# Patient Record
Sex: Female | Born: 1945 | Race: Black or African American | Hispanic: No
Health system: Southern US, Community
[De-identification: ages and names within clinical notes are randomized; demographics above are authoritative.]

## PROBLEM LIST (undated history)

## (undated) DIAGNOSIS — M549 Dorsalgia, unspecified: Secondary | ICD-10-CM

---

## 2008-12-09 ENCOUNTER — Ambulatory Visit: Payer: Self-pay | Admitting: Diagnostic Radiology

## 2008-12-09 ENCOUNTER — Emergency Department (HOSPITAL_BASED_OUTPATIENT_CLINIC_OR_DEPARTMENT_OTHER): Admission: EM | Admit: 2008-12-09 | Discharge: 2008-12-09 | Payer: Self-pay | Admitting: Emergency Medicine

## 2010-06-04 LAB — CBC
MCHC: 33.8 g/dL (ref 30.0–36.0)
MCV: 93.1 fL (ref 78.0–100.0)
RDW: 13.7 % (ref 11.5–15.5)
WBC: 5 10*3/uL (ref 4.0–10.5)

## 2010-06-04 LAB — URINE MICROSCOPIC-ADD ON

## 2010-06-04 LAB — BASIC METABOLIC PANEL
BUN: 16 mg/dL (ref 6–23)
CO2: 29 mEq/L (ref 19–32)
Calcium: 9 mg/dL (ref 8.4–10.5)
Chloride: 106 mEq/L (ref 96–112)
GFR calc non Af Amer: >60 mL/min (ref >60)
Sodium: 144 mEq/L (ref 135–145)

## 2010-06-04 LAB — URINALYSIS, ROUTINE W REFLEX MICROSCOPIC
Hgb urine dipstick: NEGATIVE
Nitrite: NEGATIVE
Protein, ur: NEGATIVE mg/dL
Specific Gravity, Urine: 1.018 (ref 1.005–1.030)
pH: 7.5 (ref 5.0–8.0)

## 2010-06-04 LAB — URINE CULTURE

## 2010-06-04 LAB — DIFFERENTIAL
Basophils Absolute: 0.1 10*3/uL (ref 0.0–0.1)
Basophils Relative: 1 % (ref 0–1)
Eosinophils Relative: 3 % (ref 0–5)
Lymphocytes Relative: 41 % (ref 12–46)
Neutrophils Relative %: 45 % (ref 43–77)

## 2010-06-04 LAB — POCT CARDIAC MARKERS: Troponin i, poc: <0.05 ng/mL (ref 0.00–0.09)

## 2011-12-06 ENCOUNTER — Emergency Department (HOSPITAL_BASED_OUTPATIENT_CLINIC_OR_DEPARTMENT_OTHER)
Admission: EM | Admit: 2011-12-06 | Discharge: 2011-12-06 | Disposition: A | Discharge status: Home / Self Care | Payer: Self-pay | Attending: Emergency Medicine | Admitting: Emergency Medicine

## 2011-12-06 ENCOUNTER — Emergency Department (HOSPITAL_BASED_OUTPATIENT_CLINIC_OR_DEPARTMENT_OTHER): Payer: Self-pay

## 2011-12-06 ENCOUNTER — Encounter (HOSPITAL_BASED_OUTPATIENT_CLINIC_OR_DEPARTMENT_OTHER): Payer: Self-pay | Admitting: Family Medicine

## 2011-12-06 DIAGNOSIS — R10819 Abdominal tenderness, unspecified site: Secondary | ICD-10-CM | POA: Insufficient documentation

## 2011-12-06 DIAGNOSIS — R109 Unspecified abdominal pain: Secondary | ICD-10-CM | POA: Insufficient documentation

## 2011-12-06 DIAGNOSIS — M5136 Other intervertebral disc degeneration, lumbar region: Secondary | ICD-10-CM

## 2011-12-06 DIAGNOSIS — M5137 Other intervertebral disc degeneration, lumbosacral region: Secondary | ICD-10-CM | POA: Insufficient documentation

## 2011-12-06 DIAGNOSIS — N3090 Cystitis, unspecified without hematuria: Secondary | ICD-10-CM | POA: Insufficient documentation

## 2011-12-06 DIAGNOSIS — M51379 Other intervertebral disc degeneration, lumbosacral region without mention of lumbar back pain or lower extremity pain: Secondary | ICD-10-CM | POA: Insufficient documentation

## 2011-12-06 HISTORY — DX: Dorsalgia, unspecified: M54.9

## 2011-12-06 LAB — URINALYSIS, ROUTINE W REFLEX MICROSCOPIC
Bilirubin Urine: NEGATIVE
Glucose, UA: NEGATIVE mg/dL
Ketones, ur: NEGATIVE mg/dL
Leukocytes, UA: NEGATIVE
Nitrite: NEGATIVE
Protein, ur: NEGATIVE mg/dL
Specific Gravity, Urine: 1.021 (ref 1.005–1.030)
Urobilinogen, UA: 1 mg/dL (ref 0.0–1.0)
pH: 6.5 (ref 5.0–8.0)

## 2011-12-06 LAB — URINE MICROSCOPIC-ADD ON

## 2011-12-06 MED ORDER — NAPROXEN 500 MG PO TABS: 500.0000 mg | ORAL_TABLET | Freq: Two times a day (BID) | ORAL | Status: DC

## 2011-12-06 MED ORDER — SULFAMETHOXAZOLE-TRIMETHOPRIM 800-160 MG PO TABS: 1.0000 | ORAL_TABLET | Freq: Two times a day (BID) | ORAL | Status: DC

## 2011-12-06 MED ORDER — HYDROCODONE-ACETAMINOPHEN 5-500 MG PO TABS: 1.0000 | ORAL_TABLET | Freq: Four times a day (QID) | ORAL | Status: DC | PRN

## 2011-12-06 NOTE — ED Notes (Signed)
Pt moved here from [place] in June per daughter. Pt does not speak [place] and daughter translating in triage. Pt c/o back pain and frequent urination since Saturday with h/o same. Pt also c/o bilateral knee pain and swelling and right arm pain, tingling x 5 days.

## 2011-12-06 NOTE — ED Notes (Signed)
Pt reports chronic back pain, but that it got severe this past Thursday. Daughter reports urinary frequency with the back pain. Pt is also c/o right arm pain and headache. Pain is 9/10. Last took 200mg  Ibuprofen approximately 3am today. Pt does not speak english. Daughter interprets for her.

## 2011-12-06 NOTE — ED Provider Notes (Addendum)
History     CSN: [id number]  Arrival date & time 12/06/11  1030   First MD Initiated Contact with Patient 12/06/11 1056      Chief Complaint  Patient presents with  . Back Pain  . Urinary Frequency    (Consider location/radiation/quality/duration/timing/severity/associated sxs/prior treatment) Patient is a 66 y.o. female presenting with back pain and frequency. The history is provided by the patient.  Back Pain  This is a chronic problem. Episode onset: 20 years of back pain but worses in the last 4 days. The problem occurs constantly. The problem has been gradually worsening. The pain is associated with no known injury. The pain is present in the lumbar spine. The quality of the pain is described as aching. The pain radiates to the left thigh and right thigh. The pain is at a severity of 8/10. The pain is moderate. The symptoms are aggravated by bending and certain positions. The pain is the same all the time. Stiffness is present all day. Associated symptoms include abdominal pain, dysuria and leg pain. Pertinent negatives include no bowel incontinence, no bladder incontinence, no paresthesias, no paresis and no weakness. She has tried nothing for the symptoms. The treatment provided no relief. Risk factors include obesity.  Urinary Frequency This is a new problem. Episode onset: 3-4 days ago. The problem occurs constantly. The problem has not changed since onset.Associated symptoms include abdominal pain. Associated symptoms comments: No fever, diarrhea. Nothing relieves the symptoms. She has tried nothing for the symptoms. The treatment provided no relief.    Past Medical History  Diagnosis Date  . Back pain     History reviewed. No pertinent past surgical history.  No family history on file.  History  Substance Use Topics  . Smoking status: Never Smoker   . Smokeless tobacco: Not on file  . Alcohol Use: No    OB History    Grav Para Term Preterm Abortions TAB SAB Ect Mult  Living                  Review of Systems  Gastrointestinal: Positive for abdominal pain. Negative for bowel incontinence.  Genitourinary: Positive for dysuria and frequency. Negative for bladder incontinence.  Musculoskeletal: Positive for back pain.  Neurological: Negative for weakness and paresthesias.  All other systems reviewed and are negative.    Allergies  Review of patient's allergies indicates no known allergies.  Home Medications  No current outpatient prescriptions on file.  BP 132/77  Pulse 77  Temp 98.8 F (37.1 C) (Oral)  Resp 16  Ht 5\' 3"  (1.6 m)  Wt 210 lb (95.255 kg)  BMI 37.20 kg/m2  SpO2 99%  Physical Exam  Nursing note and vitals reviewed. Constitutional: She is oriented to person, place, and time. She appears well-developed and well-nourished. No distress.  HENT:  Head: Normocephalic and atraumatic.  Mouth/Throat: Oropharynx is clear and moist.  Eyes: Conjunctivae normal and EOM are normal. Pupils are equal, round, and reactive to light.  Neck: Normal range of motion. Neck supple.  Cardiovascular: Normal rate, regular rhythm and intact distal pulses.   No murmur heard. Pulmonary/Chest: Effort normal and breath sounds normal. No respiratory distress. She has no wheezes. She has no rales.  Abdominal: Soft. She exhibits no distension. There is tenderness in the suprapubic area. There is no rebound, no guarding and no CVA tenderness.  Musculoskeletal: Normal range of motion. She exhibits no edema and no tenderness.       Lumbar back: She exhibits  bony tenderness. She exhibits normal range of motion, no swelling, no pain and normal pulse.  Neurological: She is alert and oriented to person, place, and time. She has normal strength. No sensory deficit.  Reflex Scores:      Patellar reflexes are 2+ on the right side and 2+ on the left side. Skin: Skin is warm and dry. No rash noted. No erythema.  Psychiatric: She has a normal mood and affect. Her behavior  is normal.    ED Course  Procedures (including critical care time)  Labs Reviewed  URINALYSIS, ROUTINE W REFLEX MICROSCOPIC - Abnormal; Notable for the following:    Hgb urine dipstick MODERATE (*)     All other components within normal limits  URINE MICROSCOPIC-ADD ON   Ct Abdomen Pelvis Wo Contrast  12/06/2011  *RADIOLOGY REPORT*  Clinical Data: Mid back pain, hematuria, urinary frequency  CT ABDOMEN AND PELVIS WITHOUT CONTRAST  Technique:  Multidetector CT imaging of the abdomen and pelvis was performed following the standard protocol without intravenous contrast.  Comparison: None.  Findings: Motion degraded images.  Lung bases are clear.  Unenhanced liver is notable for two probable cysts or hemangiomas in the hepatic dome (series 2/image 10).  Unenhanced spleen, pancreas, and adrenal glands are within normal limits.  Gallbladder is unremarkable.  No intrahepatic or extrahepatic ductal dilatation.  Kidneys are within normal limits.  No renal calculi or hydronephrosis.  No evidence of bowel obstruction.  Normal appendix.  Atherosclerotic calcifications of the abdominal aorta and branch vessels.  No abdominopelvic ascites.  No suspicious abdominopelvic lymphadenopathy.  Uterus and bilateral ovaries are unremarkable.  No ureteral or bladder calculi.  Bladder is mildly thick-walled although underdistended.  Degenerative changes of the visualized thoracolumbar spine.  IMPRESSION: Motion degraded images.  No renal, ureteral, or bladder calculi.  No hydronephrosis.  Mildly thick-walled bladder, correlate for cystitis.   Original Report Authenticated By: Charline Bills, M.D.      1. Cystitis   2. Lumbar degenerative disc disease       MDM   Pt with chronic low back pain for >20 years.  She has had worsening pain for the last 3-4 days and urinary sx.  No sx of urinary retention or bowel incontinence.  No neurovascular compromise and no incontinence.  Pt has no infectious sx, hx of CA  or other  red flags concerning for pathologic back pain.  Pt is able to ambulate but is painful.  Normal strength and reflexes on exam.  Denies trauma.  Pt is from [place] but when moved in June was assymptomatic and sx just started with the urine the last few days.  Doubt schistosomiasis, however do to a moderate amount of hemoglobin in her urine concern for possible kidney stone or other kidney pathology. CT of the abdomen and pelvis without contrast and showed no signs of renal, ureteral or bladder calculi however she did have thickening of the bladder wall consistent with cystitis which also goes along with her symptoms. Also it did show degenerative changes in her spine which is also consistent with the back pain she's been experiencing. Patient given antibiotic for a new cystitis as well as pain control. She was recommended to followup with her regular doctor which her daughter said they would do.         Gwyneth Sprout, MD 12/06/11 1249  Gwyneth Sprout, MD 12/06/11 1252

## 2015-03-04 ENCOUNTER — Emergency Department (HOSPITAL_BASED_OUTPATIENT_CLINIC_OR_DEPARTMENT_OTHER)
Admission: EM | Admit: 2015-03-04 | Discharge: 2015-03-04 | Discharge status: Against Medical Advice | Payer: Self-pay | Attending: Emergency Medicine | Admitting: Emergency Medicine

## 2015-03-04 ENCOUNTER — Encounter (HOSPITAL_BASED_OUTPATIENT_CLINIC_OR_DEPARTMENT_OTHER): Payer: Self-pay | Admitting: *Deleted

## 2015-03-04 DIAGNOSIS — H9203 Otalgia, bilateral: Secondary | ICD-10-CM | POA: Insufficient documentation

## 2015-03-04 NOTE — ED Notes (Signed)
Pt reports bilateral ear pain since flying a year ago. Has been seen at urgent care and rx atbx in November, ears got better, but now having clear drainage with "a little bit of yellow" and "pieces of dirt" coming out also per pt.

## 2015-03-04 NOTE — ED Notes (Signed)
Pt. Up to Nurse first in no distress reports she will not wait any longer.

## 2016-06-05 ENCOUNTER — Emergency Department (HOSPITAL_BASED_OUTPATIENT_CLINIC_OR_DEPARTMENT_OTHER)
Admission: EM | Admit: 2016-06-05 | Discharge: 2016-06-05 | Disposition: A | Discharge status: Home / Self Care | Payer: Medicaid Other | Attending: Emergency Medicine | Admitting: Emergency Medicine

## 2016-06-05 ENCOUNTER — Encounter (HOSPITAL_BASED_OUTPATIENT_CLINIC_OR_DEPARTMENT_OTHER): Payer: Self-pay | Admitting: *Deleted

## 2016-06-05 ENCOUNTER — Emergency Department (HOSPITAL_BASED_OUTPATIENT_CLINIC_OR_DEPARTMENT_OTHER): Payer: Medicaid Other

## 2016-06-05 DIAGNOSIS — Y929 Unspecified place or not applicable: Secondary | ICD-10-CM | POA: Insufficient documentation

## 2016-06-05 DIAGNOSIS — S8001XA Contusion of right knee, initial encounter: Secondary | ICD-10-CM | POA: Diagnosis not present

## 2016-06-05 DIAGNOSIS — Y999 Unspecified external cause status: Secondary | ICD-10-CM | POA: Insufficient documentation

## 2016-06-05 DIAGNOSIS — S32040A Wedge compression fracture of fourth lumbar vertebra, initial encounter for closed fracture: Secondary | ICD-10-CM | POA: Insufficient documentation

## 2016-06-05 DIAGNOSIS — S5001XA Contusion of right elbow, initial encounter: Secondary | ICD-10-CM | POA: Diagnosis not present

## 2016-06-05 DIAGNOSIS — S3992XA Unspecified injury of lower back, initial encounter: Secondary | ICD-10-CM | POA: Diagnosis present

## 2016-06-05 DIAGNOSIS — Y9301 Activity, walking, marching and hiking: Secondary | ICD-10-CM | POA: Insufficient documentation

## 2016-06-05 DIAGNOSIS — S60221A Contusion of right hand, initial encounter: Secondary | ICD-10-CM | POA: Diagnosis not present

## 2016-06-05 DIAGNOSIS — S8002XA Contusion of left knee, initial encounter: Secondary | ICD-10-CM | POA: Diagnosis not present

## 2016-06-05 DIAGNOSIS — W19XXXA Unspecified fall, initial encounter: Secondary | ICD-10-CM

## 2016-06-05 DIAGNOSIS — S32000A Wedge compression fracture of unspecified lumbar vertebra, initial encounter for closed fracture: Secondary | ICD-10-CM

## 2016-06-05 DIAGNOSIS — S39012A Strain of muscle, fascia and tendon of lower back, initial encounter: Secondary | ICD-10-CM

## 2016-06-05 DIAGNOSIS — W109XXA Fall (on) (from) unspecified stairs and steps, initial encounter: Secondary | ICD-10-CM | POA: Diagnosis not present

## 2016-06-05 NOTE — ED Notes (Signed)
Pt in radiology 

## 2016-06-05 NOTE — ED Triage Notes (Signed)
Pt's daughter, who lives with pt, is at bedside interpreting for triage purposes. Reports pt stepped onto her dress while walking up a step and fell onto both knees. Denies hitting head, LOC. Presents today with R hand and bil knee pain. Pt able to ambulate. Also reports shortness of breath with exertion x2wks.

## 2016-06-05 NOTE — ED Provider Notes (Signed)
[facility] Provider Note   CSN: [id number] Arrival date & time: 06/05/16  1849  By signing my name below, I, Emily Sutton, attest that this documentation has been prepared under the direction and in the presence of Emily Bucco, MD . Electronically Signed: Nelwyn Sutton, Scribe. 06/05/2016. 8:47 PM.  History   Chief Complaint Chief Complaint  Patient presents with  . Fall   The history is provided by the patient. The history is limited by a language barrier. No language interpreter was used.    HPI Comments:  Emily Sutton is a 71 y.o. female who presents to the Emergency Department complaining of bilateral knee pain, right worse than left, s/p fall three weeks ago. Pt's daughter states that her mother was on the way into the house when she tripped over her clothes and fell forward onto her hands and knees. Pt is experiencing associated back, elbow and arm pain. Her pain is exacerbated with use and movement. No medications given PTA. Denies any numbness, weakness or any other symptoms.   Past Medical History:  Diagnosis Date  . Back pain     There are no active problems to display for this patient.   History reviewed. No pertinent surgical history.  OB History    No data available       Home Medications    Prior to Admission medications   Not on File    Family History No family history on file.  Social History Social History  Substance Use Topics  . Smoking status: Never Smoker  . Smokeless tobacco: Never Used  . Alcohol use No     Allergies   Patient has no known allergies.   Review of Systems Review of Systems  Constitutional: Negative for chills, diaphoresis, fatigue and fever.  HENT: Negative for congestion, rhinorrhea and sneezing.   Eyes: Negative.   Respiratory: Negative for cough, chest tightness and shortness of breath.   Cardiovascular: Negative for chest pain and leg swelling.  Gastrointestinal: Negative for abdominal  pain, blood in stool, diarrhea, nausea and vomiting.  Genitourinary: Negative for difficulty urinating, flank pain, frequency and hematuria.  Musculoskeletal: Positive for arthralgias, back pain and myalgias.  Skin: Negative for rash.  Neurological: Negative for dizziness, speech difficulty, weakness, numbness and headaches.     Physical Exam Updated Vital Signs BP (!) 154/76 (BP Location: Right Arm)   Pulse 76   Temp 97.9 F (36.6 C) (Oral)   Resp 19   Ht  (1.651 m)   Wt 236 lb 6.4 oz (107.2 kg)   SpO2 100%   BMI 39.34 kg/m   Physical Exam  Constitutional: She is oriented to person, place, and time. She appears well-developed and well-nourished.  HENT:  Head: Normocephalic and atraumatic.  Eyes: Pupils are equal, round, and reactive to light.  Neck: Normal range of motion. Neck supple.  Cardiovascular: Normal rate, regular rhythm and normal heart sounds.   Pulmonary/Chest: Effort normal and breath sounds normal. No respiratory distress. She has no wheezes. She has no rales. She exhibits no tenderness.  Abdominal: Soft. Bowel sounds are normal. There is no tenderness. There is no rebound and no guarding.  Musculoskeletal: Normal range of motion. She exhibits no edema.  Tenderness over right 4th and 5th MCP joints. Tender over lower lumbar region. No tenderness over thoracic or cervical spine. Bruising to the anterior parts of bilateral knees. Pain on palpation on ROM of both knees and both hips. Neurovascularly intact distally. No pain in right  shoulder or wrist.   Lymphadenopathy:    She has no cervical adenopathy.  Neurological: She is alert and oriented to person, place, and time.  Skin: Skin is warm and dry. No rash noted.  Psychiatric: She has a normal mood and affect.    ED Treatments / Results  DIAGNOSTIC STUDIES:  Oxygen Saturation is 100% on RA, normal by my interpretation.    COORDINATION OF CARE:  9:00 PM Discussed treatment plan with pt at bedside which  includes X-ray imaging of hand, elbow, back, knees and hips and pt agreed to plan.  Labs (all labs ordered are listed, but only abnormal results are displayed) Labs Reviewed - No data to display  EKG  EKG Interpretation None       Radiology Dg Lumbar Spine Complete  Result Date: 06/05/2016 CLINICAL DATA:  Larey Seat 3 weeks ago with low back pain EXAM: LUMBAR SPINE - COMPLETE 4+ VIEW COMPARISON:  12/06/2011 CT abdomen and pelvis FINDINGS: Transitional lumbosacral anatomy with the lowest square vertebral body on the lateral view labeled S1. Anterior compression fracture of L4 which is new since the 2013 CT is identified with approximately 25% anterior height loss and slight retropulsion suggested. Minimal grade 1 anterolisthesis of L5 on S1. Facet joint space narrowing and sclerosis consistent with degenerative facet arthropathy is identified from L3 caudad. Moderate colonic stool burden is noted. Small triangular density projecting over the L3 vertebral body on the lateral view may represent an ingested food particle. No definite nephrolithiasis based on prior CT. Injection granulomata project over the buttocks. IMPRESSION: Lumbosacral transitional vertebral anatomy with the lowest square vertebral body labeled S1 on the lateral view. New since 2013 is a 25% anterior compression fracture of L4 with possible minimal retropulsion. MRI would be the study of choice to assess for acuity/bone marrow edema. Electronically Signed   By: Tollie Eth M.D.   On: 06/05/2016 22:19   Dg Elbow Complete Right  Result Date: 06/05/2016 CLINICAL DATA:  Pain after fall 3 weeks ago EXAM: RIGHT ELBOW - COMPLETE 3+ VIEW COMPARISON:  None. FINDINGS: Lateral humeral epicondylar spurring consistent with changes of chronic lateral epicondylitis. No acute fracture or joint effusion. Minimal degenerative spurring off the radial head. Tiny subchondral cysts of the capitellum. IMPRESSION: Minimal osteoarthritic change of the right  elbow. No acute fracture or malalignment. Spurring off the lateral epicondyle of the humerus consistent with chronic changes of lateral epicondylitis. Electronically Signed   By: Tollie Eth M.D.   On: 06/05/2016 22:25   Dg Knee Complete 4 Views Left  Result Date: 06/05/2016 CLINICAL DATA:  Pain after fall 3 weeks ago. EXAM: LEFT KNEE - COMPLETE 4+ VIEW COMPARISON:  None. FINDINGS: Osteoarthritic joint space narrowing and spurring of the femorotibial and patellofemoral compartments are identified more severely affecting the medial femorotibial compartment. No joint effusion, acute fracture nor bone destruction is seen. Tibial spine spurring consistent with osteoarthritis is also noted. No intra-articular loose bodies. Soft tissues are unremarkable. IMPRESSION: No acute osseous abnormality of the left knee. Tricompartmental osteoarthritis. Electronically Signed   By: Tollie Eth M.D.   On: 06/05/2016 23:14   Dg Knee Complete 4 Views Right  Result Date: 06/05/2016 CLINICAL DATA:  Pain after fall 3 weeks ago EXAM: RIGHT KNEE - COMPLETE 4+ VIEW COMPARISON:  None. FINDINGS: Osteoarthritic joint space narrowing and spurring of the femorotibial and patellofemoral compartments are identified more severely affecting the medial femorotibial compartment. No joint effusion, acute fracture nor bone destruction is seen. Tibial spine spurring  consistent with osteoarthritis is also noted. No intra-articular loose bodies. Soft tissues are unremarkable. IMPRESSION: Tricompartmental osteoarthritis without acute osseous appearing abnormality. Electronically Signed   By: Tollie Eth M.D.   On: 06/05/2016 23:13   Dg Hand Complete Right  Result Date: 06/05/2016 CLINICAL DATA:  Pain after fall 3 weeks ago EXAM: RIGHT HAND - COMPLETE 3+ VIEW COMPARISON:  None. FINDINGS: Mild joint space narrowing of the digits at the interphalangeal, DIP and PIP joints. Carpal rows are maintained. No acute displaced appearing fracture. Soft tissues  are unremarkable. Carpal bossing at the base of the second metacarpal may cause mass effect on the overlying extensor tendons and can be a source of mechanical irritation or pain. IMPRESSION: No acute osseous abnormality of the right hand. Carpal bossing at the base of the second metacarpal dorsally. Osteoarthritis of the DIP, PIP and interphalangeal joints of the fingers. Electronically Signed   By: Tollie Eth M.D.   On: 06/05/2016 22:27   Dg Hips Bilat With Pelvis Min 5 Views  Result Date: 06/05/2016 CLINICAL DATA:  Bilateral hip pain after fall 3 weeks ago. EXAM: DG HIP (WITH OR WITHOUT PELVIS) 5+V BILAT COMPARISON:  None. FINDINGS: Lower lumbar degenerative facet arthropathy with disc space narrowing. Bilateral injection granulomata are seen overlying the soft tissues of the flanks. The bony pelvis appears intact. Joint spaces are maintained about both hips. The pubic rami appear intact without fracture. The sacroiliac joints, pubic symphysis and pubic rami appear intact. No proximal femoral fracture is noted. IMPRESSION: No acute osseous abnormality of the pelvis and both hips. Lower lumbar degenerative disc and facet arthropathy. Electronically Signed   By: Tollie Eth M.D.   On: 06/05/2016 22:21    Procedures Procedures (including critical care time)  Medications Ordered in ED Medications - No data to display   Initial Impression / Assessment and Plan / ED Course  I have reviewed the triage vital signs and the nursing notes.  Pertinent labs & imaging results that were available during my care of the patient were reviewed by me and considered in my medical decision making (see chart for details).     No evidence of acute fractures are noted. There is a lumbar compression fracture but the acuteness is unclear. She doesn't seem to have significant tenderness to this area. She's neurologically intact. No other acute bony injuries are noted. She is able to ambulate. She was discharged home in  good condition. Symptomatic care instructions were given. She was advised to follow-up with her PCP.  Final Clinical Impressions(s) / ED Diagnoses   Final diagnoses:  Fall  Fall, initial encounter  Contusion of right hand, initial encounter  Contusion of right elbow, initial encounter  Contusion of left knee, initial encounter  Contusion of right knee, initial encounter  Lumbar strain, initial encounter  Lumbar compression fracture, closed, initial encounter [facility])    New Prescriptions There are no discharge medications for this patient. I personally performed the services described in this documentation, which was scribed in my presence.  The recorded information has been reviewed and considered.     Emily Bucco, MD 06/06/16 [phone]

## 2018-05-09 ENCOUNTER — Encounter (HOSPITAL_BASED_OUTPATIENT_CLINIC_OR_DEPARTMENT_OTHER): Payer: Self-pay

## 2018-05-09 ENCOUNTER — Other Ambulatory Visit: Payer: Self-pay

## 2018-05-09 ENCOUNTER — Emergency Department (HOSPITAL_BASED_OUTPATIENT_CLINIC_OR_DEPARTMENT_OTHER)
Admission: EM | Admit: 2018-05-09 | Discharge: 2018-05-10 | Disposition: A | Discharge status: Home / Self Care | Payer: Medicaid Other | Attending: Emergency Medicine | Admitting: Emergency Medicine

## 2018-05-09 ENCOUNTER — Emergency Department (HOSPITAL_BASED_OUTPATIENT_CLINIC_OR_DEPARTMENT_OTHER): Payer: Medicaid Other

## 2018-05-09 DIAGNOSIS — R0602 Shortness of breath: Secondary | ICD-10-CM | POA: Diagnosis present

## 2018-05-09 DIAGNOSIS — J209 Acute bronchitis, unspecified: Secondary | ICD-10-CM | POA: Insufficient documentation

## 2018-05-09 MED ORDER — IPRATROPIUM-ALBUTEROL 0.5-2.5 (3) MG/3ML IN SOLN
3.0000 mL | RESPIRATORY_TRACT | Status: DC
  Administered 2018-05-09: 3 mL via RESPIRATORY_TRACT
  Filled 2018-05-09: qty 3

## 2018-05-09 NOTE — ED Notes (Signed)
ED Provider at bedside. 

## 2018-05-09 NOTE — ED Triage Notes (Addendum)
Pt c/o subjective SOB for a week with cough, no fever, no chest pain, no recent travel, pt not in distress, denies peripheral edema

## 2018-05-09 NOTE — ED Provider Notes (Signed)
[facility] Provider Note: Lowella Dell, MD, FACEP  CSN: [id number] MRN: [id number] ARRIVAL: 05/09/18 at 2202 ROOM: [facility]08/[facility]08   CHIEF COMPLAINT  Shortness of Breath   HISTORY OF PRESENT ILLNESS  05/09/18 11:18 PM Emily Sutton is a 73 y.o. female with moderate shortness of breath for about a week.  Shortness of breath is worse with exertion but not with lying flat.  She has had occasional cough but no persistent or paroxysmal coughing.  She has not had cold symptoms, chest pain, nausea, vomiting or diarrhea.  She has no significant past medical history.  She is from [place].   Past Medical History:  Diagnosis Date  . Back pain     History reviewed. No pertinent surgical history.  No family history on file.  Social History   Tobacco Use  . Smoking status: Never Smoker  . Smokeless tobacco: Never Used  Substance Use Topics  . Alcohol use: No  . Drug use: No    Prior to Admission medications   Not on File    Allergies Patient has no known allergies.   REVIEW OF SYSTEMS  Negative except as noted here or in the History of Present Illness.   PHYSICAL EXAMINATION  Initial Vital Signs Blood pressure (!) 161/80, pulse 87, temperature 97.6 F (36.4 C), temperature source Oral, resp. rate 18, height 5\' 5"  (1.651 m), weight 93 kg, SpO2 100 %.  Examination General: Well-developed, well-nourished female in no acute distress; appearance consistent with age of record HENT: normocephalic; atraumatic Eyes: Normal appearance Neck: supple Heart: regular rate and rhythm Lungs: clear to auscultation bilaterally; yellow breath Abdomen: soft; nondistended; nontender; bowel sounds present Extremities: No deformity; full range of motion Neurologic: Awake, alert; motor function intact in all extremities and symmetric; no facial droop Skin: Warm and dry Psychiatric: Normal mood and affect   RESULTS  Summary of this visit's results, reviewed by  myself:   EKG Interpretation  Date/Time:    Ventricular Rate:    PR Interval:    QRS Duration:   QT Interval:    QTC Calculation:   R Axis:     Text Interpretation:        Laboratory Studies: No results found for this or any previous visit (from the past 24 hour(s)). Imaging Studies: Dg Chest 2 View  Result Date: 05/09/2018 CLINICAL DATA:  Shortness of breath and dry cough for a week. EXAM: CHEST - 2 VIEW COMPARISON:  None. FINDINGS: Normal heart size and pulmonary vascularity. No focal airspace disease or consolidation in the lungs. No blunting of costophrenic angles. No pneumothorax. Mediastinal contours appear intact. Calcification of the aorta. Degenerative changes in the spine and shoulders. IMPRESSION: No active cardiopulmonary disease. Electronically Signed   By: Burman Nieves M.D.   On: 05/09/2018 22:31    ED COURSE and MDM  Nursing notes and initial vitals signs, including pulse oximetry, reviewed.  Vitals:   05/09/18 2210 05/09/18 2337 05/09/18 2344  BP: (!) 161/80 124/65   Pulse: 87 83   Resp: 18 18   Temp: 97.6 F (36.4 C)    TempSrc: Oral    SpO2: 100% 100% 100%  Weight: 93 kg    Height: 5\' 5"  (1.651 m)     12:29 AM Breathing improved after DuoNeb treatment.  Will provide an inhaler and instruct her in its use.  I suspect this represents acute bronchospasm, perhaps due to environmental factors or viral illness.  She shows no evidence on chest x-ray of  pulmonary edema or acute lung disease.  PROCEDURES    ED DIAGNOSES     ICD-10-CM   1. Acute bronchitis with bronchospasm J20.9        Alexzavier Girardin, MD 05/10/18 0030

## 2018-05-10 MED ORDER — ALBUTEROL SULFATE HFA 108 (90 BASE) MCG/ACT IN AERS
2.0000 | INHALATION_SPRAY | RESPIRATORY_TRACT | Status: DC | PRN
  Administered 2018-05-10: 2 via RESPIRATORY_TRACT
  Filled 2018-05-10: qty 6.7

## 2018-05-10 NOTE — ED Notes (Signed)
Provided aerochamber and education regarding inhaler

## 2019-01-03 ENCOUNTER — Other Ambulatory Visit: Payer: Self-pay

## 2019-01-03 DIAGNOSIS — Z20822 Contact with and (suspected) exposure to covid-19: Secondary | ICD-10-CM

## 2019-01-04 LAB — NOVEL CORONAVIRUS, NAA: SARS-CoV-2, NAA: NOT DETECTED

## 2019-01-05 ENCOUNTER — Emergency Department (HOSPITAL_COMMUNITY): Payer: Medicaid Other

## 2019-01-05 ENCOUNTER — Other Ambulatory Visit: Payer: Self-pay

## 2019-01-05 ENCOUNTER — Encounter (HOSPITAL_COMMUNITY): Payer: Self-pay

## 2019-01-05 ENCOUNTER — Emergency Department (HOSPITAL_COMMUNITY)
Admission: EM | Admit: 2019-01-05 | Discharge: 2019-01-05 | Disposition: A | Discharge status: Home / Self Care | Payer: Medicaid Other | Attending: Emergency Medicine | Admitting: Emergency Medicine

## 2019-01-05 DIAGNOSIS — R05 Cough: Secondary | ICD-10-CM | POA: Diagnosis present

## 2019-01-05 DIAGNOSIS — R059 Cough, unspecified: Secondary | ICD-10-CM

## 2019-01-05 LAB — BASIC METABOLIC PANEL
Anion gap: 10 (ref 5–15)
BUN: 14 mg/dL (ref 8–23)
CO2: 24 mmol/L (ref 22–32)
Calcium: 9 mg/dL (ref 8.9–10.3)
Chloride: 105 mmol/L (ref 98–111)
Creatinine, Ser: 0.87 mg/dL (ref 0.44–1.00)
GFR calc Af Amer: >60 mL/min (ref >60)
GFR calc non Af Amer: >60 mL/min (ref >60)
Glucose, Bld: 104 mg/dL — ABNORMAL HIGH (ref 70–99)
Potassium: 4 mmol/L (ref 3.5–5.1)
Sodium: 139 mmol/L (ref 135–145)

## 2019-01-05 NOTE — ED Triage Notes (Signed)
Per pt son Pt had a bad cough 4 days ago that made it hard for her to catch her breathe. Pt denies pain, fever, chills, loss of taste or smell. Pt son is concerned for Covid. Pt is breathing normally not distress or concerns noted.

## 2019-01-05 NOTE — Discharge Instructions (Addendum)
Monitor for follow-up with your primary care doctor if you have persistent symptoms.  Return as needed for fevers, chills, shortness of breath.   The covid test done on nov 4th is negative.

## 2019-01-05 NOTE — ED Provider Notes (Signed)
[name] DEPT Provider Note   CSN: [id number] Arrival date & time: 01/05/19  1901     History   Chief Complaint cough  HPI Emily Sutton is a 73 y.o. female.     HPI Patient presents to the emergency room for evaluation of a cough.  Patient was brought in by her son.  He translated for the patient as we did not have a translator that spoke her language.  Patient had one episode several days ago of a bout of coughing.  She felt somewhat short of breath when she had that coughing spell.  She has not been coughing since then.  She has not had any fever.  She is not having any issue with her taste or smell.  No difficulty with appetite.  No vomiting or diarrhea.  No chest pain.  Patient is not feeling short of breath right now.  After reviewing the records I noted the patient had a Covid test 2 days ago.  Patient asked his mother and he was not aware that his sister brought his mother to be tested 2 days ago for the same condition. Past Medical History:  Diagnosis Date  . Back pain     There are no active problems to display for this patient.   No past surgical history on file.   OB History   No obstetric history on file.      Home Medications    Prior to Admission medications   Not on File    Family History No family history on file.  Social History Social History   Tobacco Use  . Smoking status: Never Smoker  . Smokeless tobacco: Never Used  Substance Use Topics  . Alcohol use: No  . Drug use: No     Allergies   Patient has no known allergies.   Review of Systems Review of Systems  All other systems reviewed and are negative.    Physical Exam Updated Vital Signs BP (!) 187/92   Pulse 84   Temp 98.3 F (36.8 C) (Oral)   Resp 18   SpO2 99%   Physical Exam Vitals signs and nursing note reviewed.  Constitutional:      General: She is not in acute distress.    Appearance: She is well-developed.  HENT:      Head: Normocephalic and atraumatic.     Right Ear: External ear normal.     Left Ear: External ear normal.  Eyes:     General: No scleral icterus.       Right eye: No discharge.        Left eye: No discharge.     Conjunctiva/sclera: Conjunctivae normal.  Neck:     Musculoskeletal: Neck supple.     Trachea: No tracheal deviation.  Cardiovascular:     Rate and Rhythm: Normal rate and regular rhythm.  Pulmonary:     Effort: Pulmonary effort is normal. No respiratory distress.     Breath sounds: Normal breath sounds. No stridor. No wheezing or rales.  Abdominal:     General: Bowel sounds are normal. There is no distension.     Palpations: Abdomen is soft.     Tenderness: There is no abdominal tenderness. There is no guarding or rebound.  Musculoskeletal:        General: No tenderness.  Skin:    General: Skin is warm and dry.     Findings: No rash.  Neurological:     Mental Status: She is  alert.     Cranial Nerves: No cranial nerve deficit (no facial droop, extraocular movements intact, no slurred speech).     Sensory: No sensory deficit.     Motor: No abnormal muscle tone or seizure activity.     Coordination: Coordination normal.      ED Treatments / Results  Labs (all labs ordered are listed, but only abnormal results are displayed) Labs Reviewed  BASIC METABOLIC PANEL - Abnormal; Notable for the following components:      Result Value   Glucose, Bld 104 (*)    All other components within normal limits  CBC    EKG None  Radiology Dg Chest Portable 1 View  Result Date: 01/05/2019 CLINICAL DATA:  Cough EXAM: PORTABLE CHEST 1 VIEW COMPARISON:  May 09, 2018 FINDINGS: The heart size and mediastinal contours are within normal limits. Aortic knob calcifications. Overall shallow degree of aeration. Both lungs are clear. No acute osseous abnormality. IMPRESSION: No active disease. Electronically Signed   By: Jonna Clark M.D.   On: 01/05/2019 20:44    Procedures  Procedures (including critical care time)  Medications Ordered in ED Medications - No data to display   Initial Impression / Assessment and Plan / ED Course  I have reviewed the triage vital signs and the nursing notes.  Pertinent labs & imaging results that were available during my care of the patient were reviewed by me and considered in my medical decision making (see chart for details).  Clinical Course as of Jan 05 2212  Fri Jan 05, 2019  2212 CBC clotted.  Pt and family do not want to wait for blood to be redrawn.     [JK]    Clinical Course User Index [JK] Linwood Dibbles, MD     Patient presented to the ED for evaluation of a coughing episode earlier in the week.  Patient has been asymptomatic since then.  Patient is not coughing and in no distress.  Family was concerned about Covid and it sounds like the son was not aware that his sister had the patient tested.  Medical records indicate the patient had a Covid test on November 4 and that result is negative.  At this time there does not appear to be any evidence of an acute emergency medical condition and the patient appears stable for discharge with appropriate outpatient follow up.   Final Clinical Impressions(s) / ED Diagnoses   Final diagnoses:  Cough    ED Discharge Orders    None       Linwood Dibbles, MD 01/05/19 2214

## 2019-01-05 NOTE — ED Notes (Signed)
X-ray at bedside

## 2020-08-10 IMAGING — DX DG CHEST 1V PORT
1 series · 1 of 1 positions shown · non-contrast
Comparison: May 09, 2018

CLINICAL DATA: Cough

EXAM:
PORTABLE CHEST 1 VIEW

[chest ap]
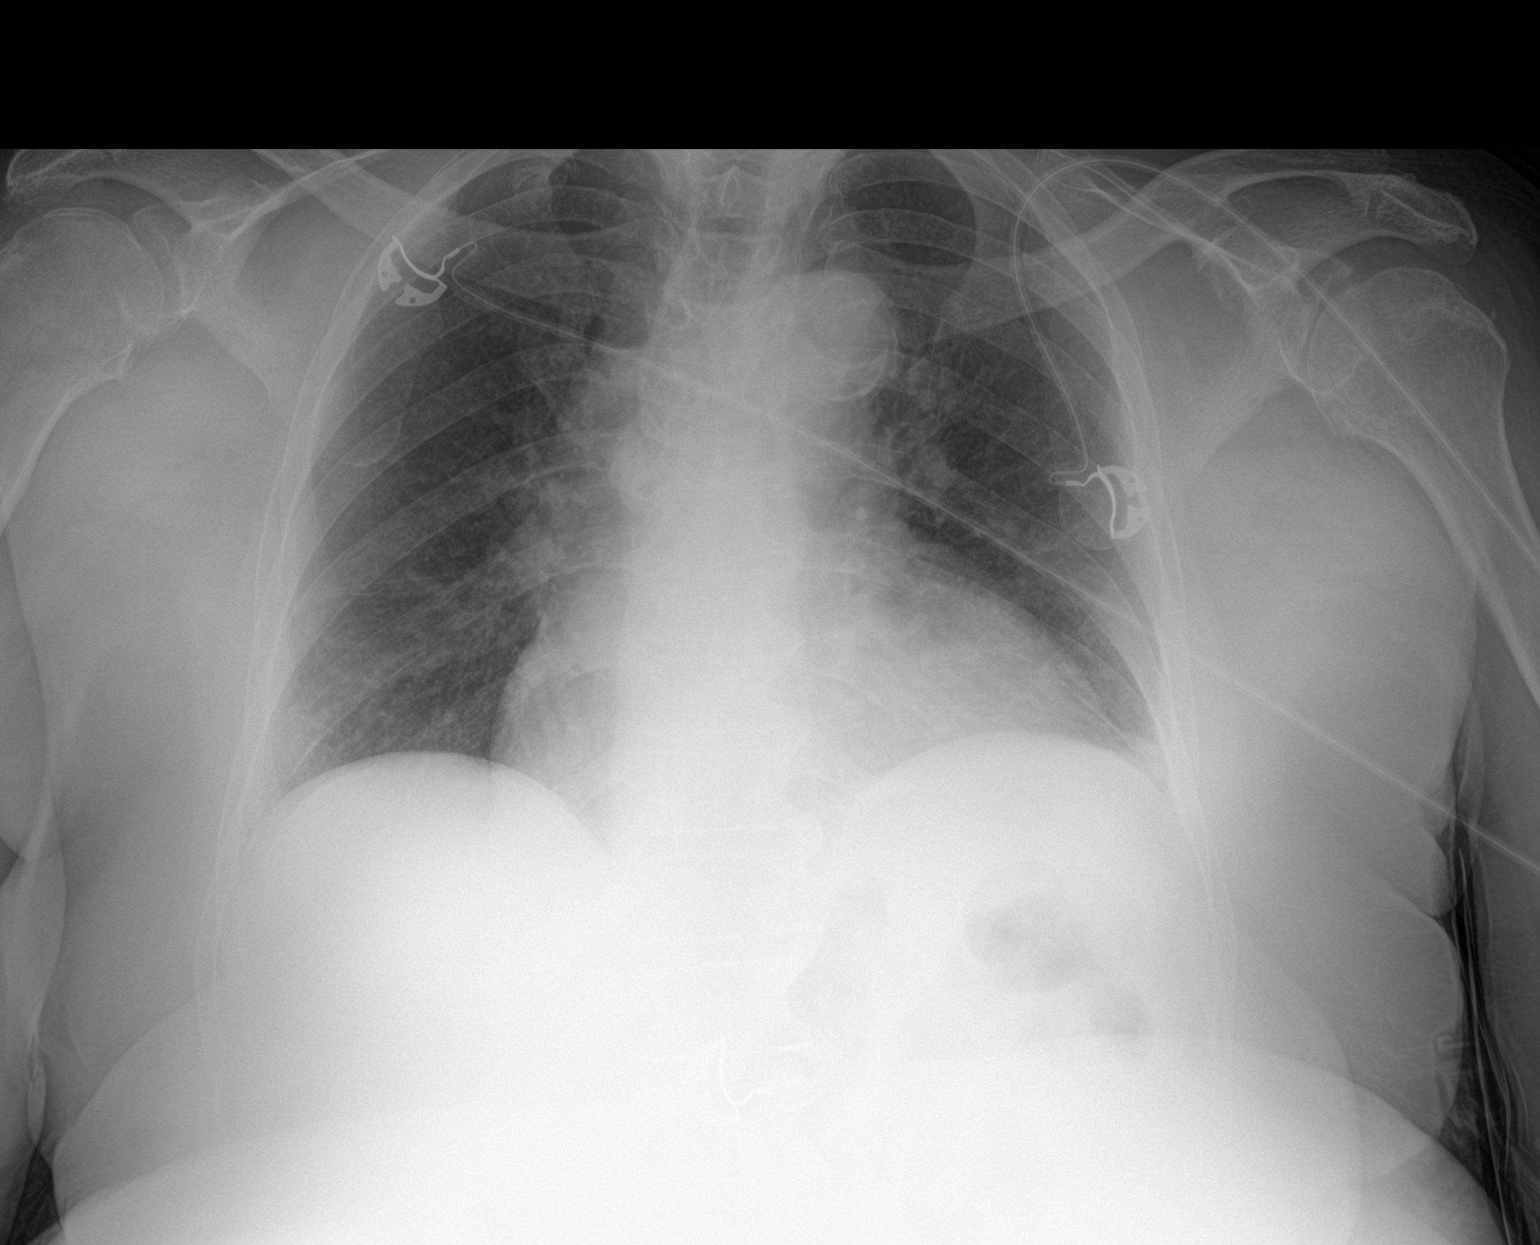

[1 of 1 positions shown; findings below may reference images not displayed]

FINDINGS: The heart size and mediastinal contours are within normal limits.
Aortic knob calcifications. Overall shallow degree of aeration. Both
lungs are clear. No acute osseous abnormality.
IMPRESSION: No active disease.

## 2022-07-14 ENCOUNTER — Encounter (HOSPITAL_BASED_OUTPATIENT_CLINIC_OR_DEPARTMENT_OTHER): Payer: Self-pay | Admitting: Radiology

## 2022-07-14 ENCOUNTER — Emergency Department (HOSPITAL_BASED_OUTPATIENT_CLINIC_OR_DEPARTMENT_OTHER)
Admission: EM | Admit: 2022-07-14 | Discharge: 2022-07-14 | Disposition: A | Discharge status: Home / Self Care | Payer: Medicaid Other | Attending: Emergency Medicine | Admitting: Emergency Medicine

## 2022-07-14 ENCOUNTER — Emergency Department (HOSPITAL_BASED_OUTPATIENT_CLINIC_OR_DEPARTMENT_OTHER): Payer: Medicaid Other

## 2022-07-14 ENCOUNTER — Other Ambulatory Visit: Payer: Self-pay

## 2022-07-14 ENCOUNTER — Emergency Department (HOSPITAL_BASED_OUTPATIENT_CLINIC_OR_DEPARTMENT_OTHER): Payer: Medicaid Other | Admitting: Radiology

## 2022-07-14 DIAGNOSIS — R0602 Shortness of breath: Secondary | ICD-10-CM | POA: Diagnosis not present

## 2022-07-14 DIAGNOSIS — I82402 Acute embolism and thrombosis of unspecified deep veins of left lower extremity: Secondary | ICD-10-CM | POA: Diagnosis not present

## 2022-07-14 DIAGNOSIS — I2694 Multiple subsegmental pulmonary emboli without acute cor pulmonale: Secondary | ICD-10-CM | POA: Diagnosis not present

## 2022-07-14 DIAGNOSIS — M7989 Other specified soft tissue disorders: Secondary | ICD-10-CM | POA: Diagnosis present

## 2022-07-14 LAB — CBC
HCT: 36.5 % (ref 36.0–46.0)
Hemoglobin: 11.4 g/dL — ABNORMAL LOW (ref 12.0–15.0)
MCH: 30 pg (ref 26.0–34.0)
MCHC: 31.2 g/dL (ref 30.0–36.0)
MCV: 96.1 fL (ref 80.0–100.0)
Platelets: 170 10*3/uL (ref 150–400)
RBC: 3.8 MIL/uL — ABNORMAL LOW (ref 3.87–5.11)
RDW: 14.6 % (ref 11.5–15.5)
WBC: 6.6 10*3/uL (ref 4.0–10.5)
nRBC: 0 % (ref 0.0–0.2)

## 2022-07-14 LAB — D-DIMER, QUANTITATIVE: D-Dimer, Quant: 9.98 ug/mL-FEU — ABNORMAL HIGH (ref 0.00–0.50)

## 2022-07-14 LAB — BASIC METABOLIC PANEL
Anion gap: 9 (ref 5–15)
BUN: 15 mg/dL (ref 8–23)
CO2: 24 mmol/L (ref 22–32)
Calcium: 8.9 mg/dL (ref 8.9–10.3)
Chloride: 106 mmol/L (ref 98–111)
Creatinine, Ser: 0.72 mg/dL (ref 0.44–1.00)
GFR, Estimated: >60 mL/min (ref >60)
Glucose, Bld: 168 mg/dL — ABNORMAL HIGH (ref 70–99)
Potassium: 4.2 mmol/L (ref 3.5–5.1)
Sodium: 139 mmol/L (ref 135–145)

## 2022-07-14 LAB — TROPONIN I (HIGH SENSITIVITY)
Troponin I (High Sensitivity): 7 ng/L (ref <18)
Troponin I (High Sensitivity): 7 ng/L (ref <18)

## 2022-07-14 LAB — BRAIN NATRIURETIC PEPTIDE: B Natriuretic Peptide: 204.7 pg/mL — ABNORMAL HIGH (ref 0.0–100.0)

## 2022-07-14 MED ORDER — IOHEXOL 350 MG/ML SOLN
100.0000 mL | Freq: Once | INTRAVENOUS | Status: AC | PRN
  Administered 2022-07-14: 75 mL via INTRAVENOUS

## 2022-07-14 MED ORDER — APIXABAN 2.5 MG PO TABS
10.0000 mg | ORAL_TABLET | Freq: Once | ORAL | Status: AC
  Administered 2022-07-14: 10 mg via ORAL
  Filled 2022-07-14: qty 4

## 2022-07-14 MED ORDER — APIXABAN (ELIQUIS) EDUCATION KIT FOR DVT/PE PATIENTS: PACK | Freq: Once | Status: AC

## 2022-07-14 MED ORDER — APIXABAN (ELIQUIS) VTE STARTER PACK (10MG AND 5MG): ORAL_TABLET | ORAL | 0 refills | Status: AC

## 2022-07-14 NOTE — Progress Notes (Signed)
RT auscultated the Pt's lungs and she was clear. The Pt was able to take a good deep breath for RT to listen to her lungs. RT will continue to monitor

## 2022-07-14 NOTE — ED Notes (Signed)
Patient transported to Ultrasound and Xray. 

## 2022-07-14 NOTE — Discharge Instructions (Signed)
As we discussed, your workup in the ER today revealed that you do have clot in your left leg as well as several clots in your lungs.  The clot in your leg appears to be new, however the clots in your lungs appear to be old.  I did offer admission to the hospital for further evaluation of this, however you have chosen to manage this at home which is reasonable.  I have given you a prescription for Eliquis which is a blood thinning medication you will need to take as prescribed every day for management of your symptoms.  Please be advised that this will make your bleeding risk higher and any sort of trauma may require you to return to the emergency department for imaging.  Additionally, I recommend that you wear compression stockings and elevate your legs is much as possible to reduce swelling.  You will also need to use schedule an appointment with your primary care doctor for follow-up in 1 week.  Return if development of any new or worsening symptoms.

## 2022-07-14 NOTE — ED Triage Notes (Signed)
Pt arrived POV with family. Pt c/o L leg swelling x1 week, worsening over the past 3 days. Denies CP, but states it feels hard to take a deep breath. Denies hx DVT or PE. Denies hx CHF. Denies any recent fall/trauma.

## 2022-07-14 NOTE — ED Notes (Signed)
Pt verbalized understanding of discharge instructions. Opportunity for questions provided.  

## 2022-07-14 NOTE — ED Provider Notes (Signed)
[name] EMERGENCY DEPARTMENT AT [facility] Provider Note   CSN: [id number] Arrival date & time: 07/14/22  1318     History  Chief Complaint  Patient presents with   Leg Swelling    Emily Sutton is a 77 y.o. female.  Patient with no pertinent past medical history presents today with complaints of left leg swelling she states that same has been ongoing for the last week.  History provided by patient's granddaughter at bedside who translates.  Patient states that she did just recently arrive back in the [place] at the beginning of March.  No travel since then.  Denies any recent surgeries.  No history of blood clots.  Denies any history of similar symptoms previously.  She does endorse intermittent shortness of breath, however states that has been unchanged for the past year or so.  She has not seen a doctor in at least 2 years, does state that she has a primary doctor in the [place] that she is established with.  She does have some leg pain as well.  Denies any numbness/tingling.  She is able to ambulate but does state it is more difficult since her leg has been bothering her.  Does endorse a few episodes of chest pain in the last few months but denies any recently or currently.  She denies fevers or chills.  No nausea, vomiting, or diarrhea.  Denies cough or congestion.  The history is provided by the patient and a relative. The history is limited by a language barrier. A language interpreter was used (Family member at bedside translates).       Home Medications Prior to Admission medications   Not on File      Allergies    Patient has no known allergies.    Review of Systems   Review of Systems  Respiratory:  Positive for shortness of breath (chronic).   Cardiovascular:  Positive for leg swelling.  All other systems reviewed and are negative.   Physical Exam Updated Vital Signs BP (!) 122/53   Pulse 85   Temp 98.2 F (36.8 C) (Oral)   Resp (!) 21    SpO2 99%  Physical Exam Vitals and nursing note reviewed.  Constitutional:      General: She is not in acute distress.    Appearance: Normal appearance. She is normal weight. She is not ill-appearing, toxic-appearing or diaphoretic.  HENT:     Head: Normocephalic and atraumatic.  Cardiovascular:     Rate and Rhythm: Normal rate and regular rhythm.     Heart sounds: Normal heart sounds.  Pulmonary:     Effort: Pulmonary effort is normal. No respiratory distress.     Breath sounds: Normal breath sounds.  Abdominal:     General: Abdomen is flat.     Palpations: Abdomen is soft.     Tenderness: There is no abdominal tenderness.  Musculoskeletal:        General: Normal range of motion.     Cervical back: Normal range of motion.     Right lower leg: No edema.     Comments: LLE with 2+ pitting edema with pain and TTP. DP and PT pulses intact and 2+. Patient able to ambulate with assistance.  Skin:    General: Skin is warm and dry.  Neurological:     General: No focal deficit present.     Mental Status: She is alert.  Psychiatric:        Mood and Affect: Mood normal.  Behavior: Behavior normal.     ED Results / Procedures / Treatments   Labs (all labs ordered are listed, but only abnormal results are displayed) Labs Reviewed  BASIC METABOLIC PANEL - Abnormal; Notable for the following components:      Result Value   Glucose, Bld 168 (*)    All other components within normal limits  CBC - Abnormal; Notable for the following components:   RBC 3.80 (*)    Hemoglobin 11.4 (*)    All other components within normal limits  BRAIN NATRIURETIC PEPTIDE - Abnormal; Notable for the following components:   B Natriuretic Peptide 204.7 (*)    All other components within normal limits  D-DIMER, QUANTITATIVE - Abnormal; Notable for the following components:   D-Dimer, Quant 9.98 (*)    All other components within normal limits  TROPONIN I (HIGH SENSITIVITY)  TROPONIN I (HIGH  SENSITIVITY)    EKG EKG Interpretation  Date/Time:  Wednesday Jul 14 2022 13:47:19 EDT Ventricular Rate:  84 PR Interval:  194 QRS Duration: 86 QT Interval:  381 QTC Calculation: 451 R Axis:   -24 Text Interpretation: Sinus rhythm Borderline left axis deviation Probable anteroseptal infarct, old ST elevation, consider inferior injury since last tracing no significant change Confirmed by [name] [phone]) on 07/14/2022 4:40:59 PM  Radiology CT Angio Chest PE W and/or Wo Contrast  Result Date: 07/14/2022 CLINICAL DATA:  Left leg swelling for a week. Worsening over the last 3 days. EXAM: CT ANGIOGRAPHY CHEST WITH CONTRAST TECHNIQUE: Multidetector CT imaging of the chest was performed using the standard protocol during bolus administration of intravenous contrast. Multiplanar CT image reconstructions and MIPs were obtained to evaluate the vascular anatomy. RADIATION DOSE REDUCTION: This exam was performed according to the departmental dose-optimization program which includes automated exposure control, adjustment of the mA and/or kV according to patient size and/or use of iterative reconstruction technique. CONTRAST:  75mL OMNIPAQUE IOHEXOL 350 MG/ML SOLN COMPARISON:  Chest x-ray 07/14/2022 and older FINDINGS: Cardiovascular: There are some linear filling defects along the right main and lower pulmonary arteries. A few small areas as well along the right upper lobe and left lower lobe. Multiple pulmonary emboli. Based on the appearance some of these could be subacute. No larger or more central embolus. Relatively mild overall clot burden. Heart is nonenlarged. Coronary artery calcifications are seen. The thoracic aorta has a normal course and caliber with some mild vascular calcifications. 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 Of note there is some mild left ventricular wall hypertrophy. Please correlate for any known history. Mediastinum/Nodes: Preserved thyroid gland. Normal  caliber thoracic esophagus with a small hiatal hernia. No specific abnormal lymph node enlargement identified in the axillary region, hilum or mediastinum. Lungs/Pleura: Significant breathing motion throughout the examination. There is some dependent areas of scar or atelectasis along the lung bases. No pneumothorax, effusion or consolidation. There is an 8 mm nodule seen in the medial right lung base just above the right hemidiaphragm. Upper Abdomen: Hepatic cystic lesions are identified. The adrenal glands are preserved in the upper abdomen. There is some calcifications along the capsule of the spleen. Please correlate with any history of prior trauma or other process. Musculoskeletal: Curvature of the spine with degenerative changes. Diffuse bridging osteophytes along the spine. Review of the MIP images confirms the above findings. Critical Value/emergent results were called by telephone at the time of interpretation on 07/14/2022 at 1:33 pm to provider [facility] , who verbally acknowledged these  results. IMPRESSION: Linear areas of filling defect in the right main pulmonary artery with some other smaller areas of filling defects consistent with pulmonary emboli. Based on the linear nature of some of these, some of these could be subacute to chronic. Overall mild clot burden. 8 mm right lower lobe lung nodule. Please correlate with any prior additional workup when appropriate. Non-contrast chest CT at 6-12 months is recommended. If the nodule is stable at time of repeat CT, then future CT at 18-24 months (from today's scan) is considered optional for low-risk patients, but is recommended for high-risk patients. This recommendation follows the consensus statement: Guidelines for Management of Incidental Pulmonary Nodules Detected on CT Images: From the Fleischner Society 2017; Radiology 2017; 284:228-243. Aortic Atherosclerosis (ICD10-I70.0). Electronically Signed   By: Karen Kays M.D.   On: 07/14/2022 16:50    DG Chest 2 View  Result Date: 07/14/2022 CLINICAL DATA:  Shortness of breath EXAM: CHEST - 2 VIEW COMPARISON:  01/05/2019 FINDINGS: Transverse diameter of heart is increased. There are no signs of pulmonary edema or focal pulmonary consolidation. There is no pleural effusion or pneumothorax. Degenerative changes are noted in left shoulder and both AC joints. IMPRESSION: No active cardiopulmonary disease. Electronically Signed   By: Ernie Avena M.D.   On: 07/14/2022 15:15   US Venous Img Lower  Left (DVT Study)  Result Date: 07/14/2022 CLINICAL DATA:  Left lower extremity pain and edema the past week. Evaluate for DVT. EXAM: LEFT LOWER EXTREMITY VENOUS DOPPLER ULTRASOUND TECHNIQUE: Gray-scale sonography with graded compression, as well as color Doppler and duplex ultrasound were performed to evaluate the lower extremity deep venous systems from the level of the common femoral vein and including the common femoral, femoral, profunda femoral, popliteal and calf veins including the posterior tibial, peroneal and gastrocnemius veins when visible. The superficial great saphenous vein was also interrogated. Spectral Doppler was utilized to evaluate flow at rest and with distal augmentation maneuvers in the common femoral, femoral and popliteal veins. COMPARISON:  None Available. FINDINGS: Contralateral Common Femoral Vein: Respiratory phasicity is normal and symmetric with the symptomatic side. No evidence of thrombus. Normal compressibility. Common Femoral Vein: There is mixed echogenic near occlusive thrombus within the left common femoral vein (images 6 and 8). Saphenofemoral Junction: No evidence of thrombus. Normal compressibility and flow on color Doppler imaging. Profunda Femoral Vein: There is hypoechoic occlusive thrombus involving the imaged portions of the left deep femoral vein (image 16). Femoral Vein: There is hypoechoic occlusive thrombus involving the proximal (image 19), mid (image 24)  and distal (image 29) aspects of the left femoral vein. Popliteal Vein: There is hypoechoic occlusive thrombus involving the left popliteal vein (image 32) Calf Veins: Appear patent where visualized. Superficial Great Saphenous Vein: No evidence of thrombus. Normal compressibility. Other Findings: There is a moderate amount of subcutaneous edema at the level of the left ankle (image 46). IMPRESSION: The examination is positive for extensive predominantly occlusive DVT extending from the left common femoral vein through the left popliteal vein. Electronically Signed   By: Simonne Come M.D.   On: 07/14/2022 15:02    Procedures Procedures    Medications Ordered in ED Medications  iohexol (OMNIPAQUE) 350 MG/ML injection 100 mL (75 mLs Intravenous Contrast Given 07/14/22 1608)  apixaban (ELIQUIS) tablet 10 mg (10 mg Oral Given 07/14/22 1821)  apixaban Everlene Balls) Education Kit for DVT/PE patients ( Does not apply Given 07/14/22 1822)    ED Course/ Medical Decision Making/ A&P  Medical Decision Making Amount and/or Complexity of Data Reviewed Labs: ordered. Radiology: ordered.  Risk Prescription drug management.   This patient is a 77 y.o. female who presents to the ED for concern of right leg swelling, shortness of breath, this involves an extensive number of treatment options, and is a complaint that carries with it a high risk of complications and morbidity. The emergent differential diagnosis prior to evaluation includes, but is not limited to,  CHF, ACS, PE, DVT . This is not an exhaustive differential.   Past Medical History / Co-morbidities / Social History: Patient speaks Dance movement psychotherapist  Physical Exam: Physical exam performed. The pertinent findings include: Per above, LLE swelling and TTP  Lab Tests: I ordered, and personally interpreted labs.  The pertinent results include:  BNP 204.7, d dimer 9.98, troponin 7 --> 7   Imaging Studies: I ordered imaging studies  including DVT US, CXR, CTA PE. I independently visualized and interpreted imaging which showed   [place]:   The examination is positive for extensive predominantly occlusive DVT extending from the left common femoral vein through the left popliteal vein.  CXR: NAD  CTA PE:  Linear areas of filling defect in the right main pulmonary artery with some other smaller areas of filling defects consistent with pulmonary emboli. Based on the linear nature of some of these, some of these could be subacute to chronic. Overall mild clot burden.   8 mm right lower lobe lung nodule. Please correlate with any prior additional workup when appropriate. Non-contrast chest CT at 6-12 months is recommended.  I agree with the radiologist interpretation.   Cardiac Monitoring:  The patient was maintained on a cardiac monitor.  My attending physician Dr. Fredderick Phenix viewed and interpreted the cardiac monitored which showed an underlying rhythm of: no STEMI. I agree with this interpretation.   Medications: I ordered medication including elliquis  for DVT/PE. Reevaluation of the patient after these medicines showed that the patient stayed the same. I have reviewed the patients home medicines and have made adjustments as needed.   Disposition:  Patient presents today with complaints of left leg swelling x 1 week.  She is afebrile, nontoxic-appearing, and in no acute distress with reassuring signs.  Ultrasound imaging does reveal a DVT in the left lower extremity.  Symptoms consistent with same.  Patient does endorse some shortness of breath, however appears to be unchanged and chronic which is consistent with patient's a CT scan of the chest which does show pulmonary emboli however appear to be subacute to chronic. However, given patients age I did offer admission with heparin and further evaluation, however patients family would prefer that she go home. She does have close pcp follow-up established. Given this with her  normal vital signs and troponin, feel that discharge is reasonable with close follow-up. Patient given first dose of Elliquis today and educated on importance of medication compliance as well as increased bleeding risk. I did also give her a walker to help with ambulation per famiy's request. Evaluation and diagnostic testing in the emergency department does not suggest an emergent condition requiring admission or immediate intervention beyond what has been performed at this time.  Plan for discharge with close PCP follow-up.  Patient is understanding and amenable with plan, educated on red flag symptoms that would prompt immediate return.  Patient discharged in stable condition.   I discussed this case with my attending physician Dr. Fredderick Phenix who cosigned this note including patient's presenting symptoms, physical exam, and planned diagnostics  and interventions. Attending physician stated agreement with plan or made changes to plan which were implemented.     Final Clinical Impression(s) / ED Diagnoses Final diagnoses:  Leg DVT (deep venous thromboembolism), acute, left (HCC)  Multiple subsegmental pulmonary emboli without acute cor pulmonale (HCC)    Rx / DC Orders ED Discharge Orders          Ordered    APIXABAN (ELIQUIS) VTE STARTER PACK (10MG  AND 5MG )       Note to Pharmacy: If starter pack unavailable, substitute with seventy-four 5 mg apixaban tabs following the above SIG directions.   07/14/22 1805          An After Visit Summary was printed and given to the patient.     Vear Clock 07/14/22 1846    [name], MD 07/14/22 2251

## 2022-07-14 NOTE — ED Notes (Signed)
Patient transported to CT 

## 2022-07-15 ENCOUNTER — Encounter (HOSPITAL_BASED_OUTPATIENT_CLINIC_OR_DEPARTMENT_OTHER): Payer: Self-pay | Admitting: Radiology
